# Patient Record
Sex: Female | Born: 1980 | Race: White | Hispanic: No | Marital: Married | Smoking: Never smoker
Health system: Southern US, Community
[De-identification: ages and names within clinical notes are randomized; demographics above are authoritative.]

## PROBLEM LIST (undated history)

## (undated) DIAGNOSIS — R51 Headache: Secondary | ICD-10-CM

## (undated) DIAGNOSIS — J3489 Other specified disorders of nose and nasal sinuses: Secondary | ICD-10-CM

---
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
## 1999-02-19 ENCOUNTER — Other Ambulatory Visit: Admission: RE | Admit: 1999-02-19 | Discharge: 1999-02-19 | Payer: Self-pay | Admitting: Obstetrics and Gynecology

## 1999-05-22 ENCOUNTER — Other Ambulatory Visit: Admission: RE | Admit: 1999-05-22 | Discharge: 1999-05-22 | Payer: Self-pay | Admitting: Obstetrics & Gynecology

## 2000-01-15 ENCOUNTER — Other Ambulatory Visit: Admission: RE | Admit: 2000-01-15 | Discharge: 2000-01-15 | Payer: Self-pay | Admitting: Obstetrics & Gynecology

## 2000-04-13 ENCOUNTER — Emergency Department (HOSPITAL_COMMUNITY): Admission: EM | Admit: 2000-04-13 | Discharge: 2000-04-13 | Payer: Self-pay

## 2000-04-13 ENCOUNTER — Encounter: Payer: Self-pay | Admitting: Emergency Medicine

## 2001-01-19 ENCOUNTER — Other Ambulatory Visit: Admission: RE | Admit: 2001-01-19 | Discharge: 2001-01-19 | Payer: Self-pay | Admitting: Obstetrics & Gynecology

## 2002-02-21 ENCOUNTER — Other Ambulatory Visit: Admission: RE | Admit: 2002-02-21 | Discharge: 2002-02-21 | Payer: Self-pay | Admitting: Obstetrics & Gynecology

## 2003-05-09 ENCOUNTER — Encounter: Admission: RE | Admit: 2003-05-09 | Discharge: 2003-05-09 | Payer: Self-pay | Admitting: Internal Medicine

## 2004-02-12 ENCOUNTER — Other Ambulatory Visit: Admission: RE | Admit: 2004-02-12 | Discharge: 2004-02-12 | Payer: Self-pay | Admitting: Obstetrics & Gynecology

## 2005-02-18 ENCOUNTER — Other Ambulatory Visit: Admission: RE | Admit: 2005-02-18 | Discharge: 2005-02-18 | Payer: Self-pay | Admitting: Obstetrics & Gynecology

## 2007-02-21 ENCOUNTER — Inpatient Hospital Stay (HOSPITAL_COMMUNITY): Admission: AD | Admit: 2007-02-21 | Discharge: 2007-02-28 | Payer: Self-pay | Admitting: Obstetrics & Gynecology

## 2007-02-24 ENCOUNTER — Encounter (INDEPENDENT_AMBULATORY_CARE_PROVIDER_SITE_OTHER): Payer: Self-pay | Admitting: Obstetrics & Gynecology

## 2010-05-09 ENCOUNTER — Other Ambulatory Visit: Payer: Self-pay | Admitting: Obstetrics & Gynecology

## 2010-05-21 ENCOUNTER — Other Ambulatory Visit: Payer: Self-pay | Admitting: Obstetrics & Gynecology

## 2010-08-05 NOTE — Op Note (Signed)
[name], SEVERTSON              ACCOUNT NO.:  [id number]   MEDICAL RECORD NO.:  [id number]          PATIENT TYPE:  INP   LOCATION:  9155                          FACILITY:  [facility]   PHYSICIAN:  Gerrit Friends. Aldona Bar, M.D.   DATE OF BIRTH:  10/28/1980   DATE OF PROCEDURE:  02/24/2007  DATE OF DISCHARGE:                               OPERATIVE REPORT   PREOPERATIVE DIAGNOSES:  56 to 34-week intrauterine pregnancy, preterm  labor, probable borderline preeclampsia, twins, status post antenatal  steroids on tocolysis - breaking through.   POSTOPERATIVE DIAGNOSES:  33 to 34-week intrauterine pregnancy, preterm  labor, probable borderline preeclampsia, twins, status post antenatal  steroids on tocolysis - breaking through, delivery of twin A from a  breech presentation, weight 2180 grams, Apgars 7 and 8, and delivery of  twin B vertex, 2094 grams, Apgars 7 and 8.   PROCEDURE:  Primary low transverse cesarean section.   SURGEON:  Dr. Aldona Bar.   Spinal, Dr. Arby Barrette.   HISTORY:  This 30 year old gravida 2, para 0 was admitted on 02/21/07  await by Dr. Dareen Piano at almost [redacted] weeks gestation with known twins and  a pregnancy was uncomplicated prior to admission.  On admission the  patient was noted to be having contractions.  Her cervix was a  centimeter dilated with the vertex at -2 station she was admitted.  She  underwent tocolysis and eventually required magnesium sulfate, was  treated with intravenous ampicillin after strep culture was done and  received antenatal steroids - dexamethasone protocol.  She ultimately  was weaned off her magnesium sulfate and placed on Procardia and on the  morning of 12/04 was not having much in the way of contractions.  Her  ampicillin was discontinued as her strep culture was negative and plans  were made to watch her for another 24 hours and hopefully discharge her  home on Procardia.  Around 10 o'clock on the morning of 12/04 the  patient began having strong  palpable contractions and on evaluation, was  found to be at least 3 cm dilated and felt like on examination the  presenting part of twin A was breech.  Decision was made to proceed with  delivery by cesarean section rather than go back to magnesium sulfate as  the patient was already on oral Procardia.  Both babies looked good and  her gestational age was beyond 62 weeks' gestation.   The patient was taken to the operating room where spinal was placed by  Dr. Arby Barrette without difficulty.  After spinal was placed, the patient  was positioned appropriately and thereafter was prepped and draped in  usual fashion for cesarean section with a Foley catheter inserted as  part of prep.   After the patient was appropriately draped and good anesthetic levels  were documented procedure was begun.  A Pfannenstiel incision was made  and with minimal difficulty dissected down sharply to and through the  fascia in low transverse fashion with hemostasis created each layer.  Subfascial space was created inferiorly and superiorly, muscles  separated in the midline, peritoneum identified and entered  appropriately  with good entrance in the peritoneal cavity with care  taken to avoid the bowel superiorly and bladder inferiorly.  At this  time the vesicouterine peritoneum was identified, incised in a low  transverse fashion and pushed off the lower uterine segment with ease.  Sharp incision into the lower uterine segment was then made with  Metzenbaum scissors and amniotomy produced and incision extended  laterally.  It became apparent that what was felt to have been twin B  and was known to be breech had slipped below twin A and indeed twin A  was what previously was thought to be twin B and was delivered from a  breech presentation using a modified __________ maneuver without  difficulty.  Once the cord was clamped and cut, the infant was passed  off to the waiting team and subsequently taken to the  newborn intensive  care unit.  Subsequent weight was found to be  2180 grams and Apgars  were noted to be 7and 8.  This was accomplished of course after the cord  was clamped and identified as being from twin A.   At this time amniotomy was produced and twin B which previously had been  twin A was delivered from vertex position.  Amniotic fluid on both twin  A and twin B was noted to be clear.  Once twin B was delivered and the  cord was clamped and cut the infant was passed off to the awaiting team  and subsequently taken to the newborn intensive care unit as well.  The  weight was noted to be 2094 grams and Apgars were 7 and 8.   At this time cord bloods were collected from twin A and twin B  respectively and placenta was delivered intact.  Placenta was then sent  to pathology labeled twins at 103 weeks' gestation.  Once the placenta  was delivered, good uterine contractility was afforded with slowly given  intravenous Pitocin and manual stimulation and the uterus was  exteriorized.  At this time, closure of the uterine incision was carried  out using #1 Vicryl running locking fashion from angle to angle  bilaterally and additional figure-of-eight interrupted #1 Vicryl sutures  placed for additional hemostasis and support of the incision.  Once this  was carried out, the incision was dry.  Tubes and ovaries were inspected  and noted to be normal.  Cul-de-sac was free of any blood and clot and  with good uterine contractility and the incision dry.  The uterus was  then replaced in the abdominal cavity.  At this time counts were noted  to be correct and no foreign bodies noted to be remaining abdominal  cavity.  After irrigation again noting the uterine incision was dry,  closure of the abdomen was carried out in layers.  The abdominal  peritoneum was closed with 0 Vicryl in running fashion and muscle  secured with same.  Assured of good subfascial hemostasis, fascia was  then  reapproximated using 0 Vicryl from angle to midline bilaterally.  Subcutaneous tissues were rendered hemostatic and staples were used to  close skin.  Sterile pressure dressing was applied and the patient at  this time was transported to recovery room in satisfactory condition,  having tolerated procedure well.  Estimated blood loss 500 mL.  All  counts correct x2.  Both babies were transported to the newborn  intensive care unit under the care of Dr. Ruben Gottron.  Mother was  transported to recovery room and was  doing well.   In summary this 30 year old gravida 2, para 0 was admitted to hospital  on 12/01 with preterm contractions, began having increased preterm  contractions on oral tocolysis after having received magnesium sulfate  and steroids and was found to be progressively dilating her cervix.  She  was taken to the operating room and delivered by primary low transverse  cesarean section.  Twin A was delivered from a breech presentation and  twin B was delivered from vertex presentation.  In utero twin B was  really twin A and twin A was really twin B but apparently once the  patient began laboring, twin B slipped below twin A and was delivered  and called twin A.   Condition on arrival in recovery satisfactory.      Gerrit Friends. Aldona Bar, M.D.  Electronically Signed     RMW/MEDQ  D:  02/24/2007  T:  02/24/2007  Job:  [id number]

## 2010-08-08 NOTE — Discharge Summary (Signed)
NAMELILLYANNE, [name]              ACCOUNT NO.:  [id number]   MEDICAL RECORD NO.:  [id number]          PATIENT TYPE:  INP   LOCATION:  9317                          FACILITY:  [facility]   PHYSICIAN:  Malva Limes, M.D.    DATE OF BIRTH:  [birth year]   DATE OF ADMISSION:  02/21/2007  DATE OF DISCHARGE:  02/28/2007                               DISCHARGE SUMMARY   FINAL DIAGNOSES:  Twin gestation 33-34 weeks with preterm labor, mild  preeclampsia.   PROCEDURE:  Primary low transverse cesarean section.   SURGEON:  Gerrit Friends. Aldona Bar, M.D.   COMPLICATIONS:  None.   HISTORY:  This 30 year old G2, P0-0-1-0 presented at [redacted] weeks gestation  with known twin gestation and uterine contractions, preterm labor.  The  patient's antepartum course up to this point had been uncomplicated.  The patient did conceive on Clomid.  Ultrasounds had been performed  showing concordant growth and normal AFIs.  The patient's cervix upon  admission was 1 cm dilated and a -2 station.  The patient underwent  tocolysis and eventually required mag sulfate.  Group B strep culture  was performed and the patient was started on IV ampicillin for  prophylaxis.  The patient was able to be weaned off her mag sulfate and  placed on oral Procardia on the morning of December 4.  Group B strep  culture returned at that time and it was negative and the patient was  planned on being able to be sent home later that day but the patient  started having some more palpable contractions at that time, was 33 cm  dilated and presenting part of twin A was breech.  At this point a  discussion was had with the patient and the decision was to proceed with  a cesarean section at this time.  The patient was taken to the operating  room on February 24, 2007, by Dr. Annamaria Helling where a primary low  transverse cesarean section was performed with the delivery of twin A  weighing 4 pounds 12.9 ounces, a female infant with Apgars of 7 and 9.  Twin A was in  a breech presentation.  Twin B was then delivered weighing  4 pounds, 9.8 ounces, another female infant with Apgars of 7 and 9.  Twin  B was in a vertex presentation.  Delivery went without complications.  The patient's postoperative course was benign without any significant  fevers.  The babies were in the NICU but stable.  Blood pressures were  slightly elevated at this time.  The patient was started on some  hydrochlorothiazide on the 6th.  Her blood pressures continued to be  elevated and therefore the patient was increased to 25 mg of  hydrochlorothiazide and then changed to labetalol on postoperative day  #4.  By postoperative day #4 the patient's blood pressure stabilized on  labetalol and she was felt ready for discharge.  She was sent home on a  regular diet to decrease her activities, to continue her labetalol 100  mg b.i.d.  She was given Percocet one to two every 4-6 hours as needed  for the pain.  The patient was to call with any increased temperatures  or elevated blood pressures or signs or symptoms of preeclampsia.  Instructions and precautions were reviewed with the patient and she was  to follow up in our office as directed.   LABS ON DISCHARGE:  The patient had a hemoglobin of 10.6, white blood  cell count of 12.4, platelets of 128,000 which had come up from a low of  110,000.  The patient also had recurrent normal PIH panel except for the  one on the 5th which did show an elevated LDH of 276 but normal uric  acid and normal liver function.      Leilani Able, P.A.-C.    ______________________________  Malva Limes, M.D.    MB/MEDQ  D:  03/27/2007  T:  03/27/2007  Job:  [id number]

## 2010-09-30 ENCOUNTER — Other Ambulatory Visit: Payer: Self-pay | Admitting: Obstetrics & Gynecology

## 2010-12-29 LAB — DIFFERENTIAL
Basophils Relative: 0
Eosinophils Absolute: 0.1 — ABNORMAL LOW
Eosinophils Relative: 1
Eosinophils Relative: 1
Lymphocytes Relative: 16
Monocytes Absolute: 1.1 — ABNORMAL HIGH
Monocytes Relative: 8
Neutro Abs: 7.4
Neutro Abs: 9.2 — ABNORMAL HIGH
Neutrophils Relative %: 69
Neutrophils Relative %: 74

## 2010-12-29 LAB — COMPREHENSIVE METABOLIC PANEL
AST: 21
AST: 32
Albumin: 1.8 — ABNORMAL LOW
Albumin: 2.5 — ABNORMAL LOW
Alkaline Phosphatase: 114
Alkaline Phosphatase: 140 — ABNORMAL HIGH
BUN: 2 — ABNORMAL LOW
BUN: 7
CO2: 20
CO2: 20
CO2: 25
Calcium: 7 — ABNORMAL LOW
Calcium: 8.2 — ABNORMAL LOW
Chloride: 107
Chloride: 109
Creatinine, Ser: 0.63
GFR calc Af Amer: >60
GFR calc Af Amer: >60
GFR calc Af Amer: >60
GFR calc non Af Amer: >60
GFR calc non Af Amer: >60
Glucose, Bld: 152 — ABNORMAL HIGH
Glucose, Bld: 92
Glucose, Bld: 99
Potassium: 3.3 — ABNORMAL LOW
Potassium: 4.1
Sodium: 134 — ABNORMAL LOW
Sodium: 137
Sodium: 138
Sodium: 139
Total Bilirubin: 0.6
Total Bilirubin: 0.8
Total Protein: 4.3 — ABNORMAL LOW

## 2010-12-29 LAB — CBC
HCT: 30.2 — ABNORMAL LOW
HCT: 33.7 — ABNORMAL LOW
HCT: 35.7 — ABNORMAL LOW
Hemoglobin: 12.3
MCHC: 35.2
MCV: 93.8
Platelets: 110 — ABNORMAL LOW
Platelets: 129 — ABNORMAL LOW
RBC: 3.57 — ABNORMAL LOW
RBC: 3.84 — ABNORMAL LOW
RDW: 13.3
WBC: 10.5
WBC: 10.7 — ABNORMAL HIGH
WBC: 12.4 — ABNORMAL HIGH
WBC: 9.9

## 2010-12-29 LAB — URINALYSIS, ROUTINE W REFLEX MICROSCOPIC
Bilirubin Urine: NEGATIVE
Glucose, UA: NEGATIVE
Hgb urine dipstick: NEGATIVE
Ketones, ur: NEGATIVE
Nitrite: NEGATIVE
Specific Gravity, Urine: <1.005 — ABNORMAL LOW
pH: 6.5

## 2010-12-29 LAB — LACTATE DEHYDROGENASE
LDH: 136
LDH: 147

## 2010-12-29 LAB — URIC ACID: Uric Acid, Serum: 5.5

## 2012-02-01 ENCOUNTER — Other Ambulatory Visit: Payer: Self-pay | Admitting: Otolaryngology

## 2012-02-01 DIAGNOSIS — R0981 Nasal congestion: Secondary | ICD-10-CM

## 2012-02-04 ENCOUNTER — Ambulatory Visit
Admission: RE | Admit: 2012-02-04 | Discharge: 2012-02-04 | Disposition: A | Discharge status: Home / Self Care | Payer: BC Managed Care – PPO | Source: Ambulatory Visit | Attending: Otolaryngology | Admitting: Otolaryngology

## 2012-02-04 DIAGNOSIS — R0981 Nasal congestion: Secondary | ICD-10-CM

## 2012-02-21 HISTORY — PX: NASAL SEPTUM SURGERY: SHX37

## 2012-08-21 DIAGNOSIS — J3489 Other specified disorders of nose and nasal sinuses: Secondary | ICD-10-CM

## 2012-08-21 HISTORY — DX: Other specified disorders of nose and nasal sinuses: J34.89

## 2012-09-15 ENCOUNTER — Encounter (HOSPITAL_BASED_OUTPATIENT_CLINIC_OR_DEPARTMENT_OTHER): Payer: Self-pay | Admitting: *Deleted

## 2012-09-19 NOTE — H&P (Signed)
PREOPERATIVE H&P  Chief Complaint: nodule in nose  HPI: [name] is a 32 y.o. female who presents for evaluation of nodule on the nasal dorsum that extends to the upper cartilaginous septum. She's taken to the OR to have this removed.  Past Medical History  Diagnosis Date  . Headache(784.0)     migraines  . Internal nasal lesion 08/2012    nasal nodule   Past Surgical History  Procedure Laterality Date  . Nasal septum surgery  02/2012  . Cesarean section  02/24/2007   History   Social History  . Marital Status: Married    Spouse Name: N/A    Number of Children: N/A  . Years of Education: N/A   Social History Main Topics  . Smoking status: Never Smoker   . Smokeless tobacco: Never Used  . Alcohol Use: No  . Drug Use: No  . Sexually Active: None   Other Topics Concern  . None   Social History Narrative  . None   History reviewed. No pertinent family history. Allergies  Allergen Reactions  . Latex Rash    PERINEAL AREA ONLY  . Monistat (Miconazole Nitrate-Wipes) Swelling and Rash   Prior to Admission medications   Medication Sig Start Date End Date Taking? Authorizing Provider  Multiple Vitamin (MULTIVITAMIN) tablet Take 1 tablet by mouth daily.   Yes Historical Provider, MD  naratriptan (AMERGE) 1 MG TABS Take 1 mg by mouth once as needed. Take one (1) tablet at onset of headache; if returns or does not resolve, may repeat after 4 hours; do not exceed five (5) mg in 24 hours.   Yes Historical Provider, MD  norgestimate-ethinyl estradiol (ORTHO-CYCLEN,SPRINTEC,PREVIFEM) 0.25-35 MG-MCG tablet Take 1 tablet by mouth daily.   Yes Historical Provider, MD     Positive ROS: partial nasal obstruction.  All other systems have been reviewed and were otherwise negative with the exception of those mentioned in the HPI and as above.  Physical Exam: There were no vitals filed for this visit.  General: Alert, no acute distress Oral: Normal oral mucosa and  tonsils Nasal: Nodule on nasal septum extends to upper cartilaginous septum. Neck: No palpable adenopathy or thyroid nodules Ear: Ear canal is clear with normal appearing TMs Cardiovascular: Regular rate and rhythm, no murmur.  Respiratory: Clear to auscultation Neurologic: Alert and oriented x 3   Assessment/Plan: nasal nodule Plan for Procedure(s): EXCISION OF A  INNER NASAL LESION   Dillard Cannon, MD 09/19/2012 2:21 PM 2

## 2012-09-20 ENCOUNTER — Encounter (HOSPITAL_BASED_OUTPATIENT_CLINIC_OR_DEPARTMENT_OTHER): Payer: Self-pay

## 2012-09-20 ENCOUNTER — Ambulatory Visit (HOSPITAL_BASED_OUTPATIENT_CLINIC_OR_DEPARTMENT_OTHER)
Admission: RE | Admit: 2012-09-20 | Discharge: 2012-09-20 | Disposition: A | Discharge status: Home / Self Care | Payer: BC Managed Care – PPO | Source: Ambulatory Visit | Attending: Otolaryngology | Admitting: Otolaryngology

## 2012-09-20 ENCOUNTER — Encounter (HOSPITAL_BASED_OUTPATIENT_CLINIC_OR_DEPARTMENT_OTHER)
Admission: RE | Disposition: A | Discharge status: Home / Self Care | Payer: Self-pay | Source: Ambulatory Visit | Attending: Otolaryngology

## 2012-09-20 ENCOUNTER — Encounter (HOSPITAL_BASED_OUTPATIENT_CLINIC_OR_DEPARTMENT_OTHER): Payer: Self-pay | Admitting: Anesthesiology

## 2012-09-20 ENCOUNTER — Ambulatory Visit (HOSPITAL_BASED_OUTPATIENT_CLINIC_OR_DEPARTMENT_OTHER): Payer: BC Managed Care – PPO | Admitting: Anesthesiology

## 2012-09-20 DIAGNOSIS — J3489 Other specified disorders of nose and nasal sinuses: Secondary | ICD-10-CM | POA: Insufficient documentation

## 2012-09-20 DIAGNOSIS — G43909 Migraine, unspecified, not intractable, without status migrainosus: Secondary | ICD-10-CM | POA: Insufficient documentation

## 2012-09-20 HISTORY — DX: Headache: R51

## 2012-09-20 HISTORY — PX: EXCISION NASAL MASS: SHX6271

## 2012-09-20 HISTORY — DX: Other specified disorders of nose and nasal sinuses: J34.89

## 2012-09-20 SURGERY — EXCISION, MASS, NOSE
Anesthesia: General | Site: Nose | Wound class: Clean Contaminated

## 2012-09-20 MED ORDER — FENTANYL CITRATE 0.05 MG/ML IJ SOLN
INTRAMUSCULAR | Status: DC | PRN
  Administered 2012-09-20 (×2): 50 ug via INTRAVENOUS

## 2012-09-20 MED ORDER — MIDAZOLAM HCL 2 MG/ML PO SYRP: 12.0000 mg | ORAL_SOLUTION | Freq: Once | ORAL | Status: DC | PRN

## 2012-09-20 MED ORDER — MIDAZOLAM HCL 5 MG/5ML IJ SOLN
INTRAMUSCULAR | Status: DC | PRN
  Administered 2012-09-20: 2 mg via INTRAVENOUS

## 2012-09-20 MED ORDER — CEFAZOLIN SODIUM-DEXTROSE 2-3 GM-% IV SOLR
INTRAVENOUS | Status: DC | PRN
  Administered 2012-09-20: 2 g via INTRAVENOUS

## 2012-09-20 MED ORDER — ONDANSETRON HCL 4 MG/2ML IJ SOLN
INTRAMUSCULAR | Status: DC | PRN
  Administered 2012-09-20: 4 mg via INTRAVENOUS

## 2012-09-20 MED ORDER — LIDOCAINE HCL (CARDIAC) 20 MG/ML IV SOLN
INTRAVENOUS | Status: DC | PRN
  Administered 2012-09-20: 50 mg via INTRAVENOUS

## 2012-09-20 MED ORDER — CEPHALEXIN 500 MG PO CAPS: 500.0000 mg | ORAL_CAPSULE | Freq: Two times a day (BID) | ORAL | Status: AC

## 2012-09-20 MED ORDER — LACTATED RINGERS IV SOLN
INTRAVENOUS | Status: DC
  Administered 2012-09-20 (×2): via INTRAVENOUS

## 2012-09-20 MED ORDER — PROPOFOL 10 MG/ML IV BOLUS
INTRAVENOUS | Status: DC | PRN
  Administered 2012-09-20: 150 mg via INTRAVENOUS

## 2012-09-20 MED ORDER — SUCCINYLCHOLINE CHLORIDE 20 MG/ML IJ SOLN
INTRAMUSCULAR | Status: DC | PRN
  Administered 2012-09-20: 80 mg via INTRAVENOUS

## 2012-09-20 MED ORDER — FENTANYL CITRATE 0.05 MG/ML IJ SOLN: 50.0000 ug | INTRAMUSCULAR | Status: DC | PRN

## 2012-09-20 MED ORDER — MIDAZOLAM HCL 2 MG/2ML IJ SOLN: 1.0000 mg | INTRAMUSCULAR | Status: DC | PRN

## 2012-09-20 MED ORDER — EPHEDRINE SULFATE 50 MG/ML IJ SOLN
INTRAMUSCULAR | Status: DC | PRN
  Administered 2012-09-20 (×2): 10 mg via INTRAVENOUS

## 2012-09-20 MED ORDER — OXYMETAZOLINE HCL 0.05 % NA SOLN
NASAL | Status: DC | PRN
  Administered 2012-09-20: 1 via NASAL

## 2012-09-20 MED ORDER — DEXAMETHASONE SODIUM PHOSPHATE 4 MG/ML IJ SOLN
INTRAMUSCULAR | Status: DC | PRN
  Administered 2012-09-20: 10 mg via INTRAVENOUS

## 2012-09-20 MED ORDER — HYDROMORPHONE HCL PF 1 MG/ML IJ SOLN: 0.2500 mg | INTRAMUSCULAR | Status: DC | PRN

## 2012-09-20 MED ORDER — LIDOCAINE-EPINEPHRINE 1 %-1:100000 IJ SOLN
INTRAMUSCULAR | Status: DC | PRN
  Administered 2012-09-20: 1 mL

## 2012-09-20 SURGICAL SUPPLY — 39 items
APPLICATOR COTTON TIP 6IN STRL (MISCELLANEOUS) ×2 IMPLANT
CANISTER SUCTION 1200CC (MISCELLANEOUS) ×2 IMPLANT
CLOTH BEACON ORANGE TIMEOUT ST (SAFETY) ×2 IMPLANT
COAGULATOR SUCT 8FR VV (MISCELLANEOUS) IMPLANT
CORDS BIPOLAR (ELECTRODE) IMPLANT
DECANTER SPIKE VIAL GLASS SM (MISCELLANEOUS) IMPLANT
DEPRESSOR TONGUE BLADE STERILE (MISCELLANEOUS) IMPLANT
DRESSING NASAL POPE 10X1.5X2.5 (GAUZE/BANDAGES/DRESSINGS) IMPLANT
DRSG NASAL POPE 10X1.5X2.5 (GAUZE/BANDAGES/DRESSINGS)
DRSG TELFA 3X8 NADH (GAUZE/BANDAGES/DRESSINGS) IMPLANT
ELECT REM PT RETURN 9FT ADLT (ELECTROSURGICAL) ×2
ELECT REM PT RETURN 9FT PED (ELECTROSURGICAL)
ELECTRODE REM PT RETRN 9FT PED (ELECTROSURGICAL) IMPLANT
ELECTRODE REM PT RTRN 9FT ADLT (ELECTROSURGICAL) IMPLANT
GAUZE SPONGE 4X4 12PLY STRL LF (GAUZE/BANDAGES/DRESSINGS) ×1 IMPLANT
GLOVE BIOGEL PI IND STRL 6.5 (GLOVE) IMPLANT
GLOVE BIOGEL PI IND STRL 7.0 (GLOVE) IMPLANT
GLOVE BIOGEL PI INDICATOR 6.5 (GLOVE) ×1
GLOVE BIOGEL PI INDICATOR 7.0 (GLOVE) ×1
GLOVE SKINSENSE NS SZ7.5 (GLOVE) ×1
GLOVE SKINSENSE STRL SZ7.5 (GLOVE) IMPLANT
GLOVE SS BIOGEL STRL SZ 7.5 (GLOVE) ×1 IMPLANT
GLOVE SUPERSENSE BIOGEL SZ 7.5 (GLOVE)
GOWN PREVENTION PLUS XLARGE (GOWN DISPOSABLE) ×2 IMPLANT
GOWN PREVENTION PLUS XXLARGE (GOWN DISPOSABLE) ×1 IMPLANT
HEMOSTAT SURGICEL 2X14 (HEMOSTASIS) IMPLANT
NEEDLE 27GAX1X1/2 (NEEDLE) ×1 IMPLANT
PACK BASIN DAY SURGERY FS (CUSTOM PROCEDURE TRAY) ×1 IMPLANT
PACK ENT DAY SURGERY (CUSTOM PROCEDURE TRAY) ×1 IMPLANT
PAD DRESSING TELFA 3X8 NADH (GAUZE/BANDAGES/DRESSINGS) IMPLANT
PATTIES SURGICAL .5 X3 (DISPOSABLE) ×1 IMPLANT
SHEET MEDIUM DRAPE 40X70 STRL (DRAPES) ×1 IMPLANT
SOLUTION BUTLER CLEAR DIP (MISCELLANEOUS) IMPLANT
SPONGE GAUZE 2X2 8PLY STRL LF (GAUZE/BANDAGES/DRESSINGS) ×1 IMPLANT
SUT CHROMIC 5 0 P 3 (SUTURE) ×1 IMPLANT
SYR CONTROL 10ML LL (SYRINGE) IMPLANT
TOWEL OR 17X24 6PK STRL BLUE (TOWEL DISPOSABLE) ×2 IMPLANT
TUBE CONNECTING 20X1/4 (TUBING) ×1 IMPLANT
YANKAUER SUCT BULB TIP NO VENT (SUCTIONS) ×2 IMPLANT

## 2012-09-20 NOTE — Brief Op Note (Signed)
09/20/2012  8:37 AM  PATIENT:  [name]  32 y.o. female  PRE-OPERATIVE DIAGNOSIS:  nasal nodule  POST-OPERATIVE DIAGNOSIS:  nasal nodule  PROCEDURE:  Procedure(s): EXCISION OF A  INNER NASAL LESION (N/A)  SURGEON:  Surgeon(s) and Role:    * Drema Halon, MD - Primary  PHYSICIAN ASSISTANT:   ASSISTANTS: none   ANESTHESIA:   general  EBL:  Total I/O In: 1000 [I.V.:1000] Out: -   BLOOD ADMINISTERED:none  DRAINS: none   LOCAL MEDICATIONS USED:  XYLOCAINE wit EPI  SPECIMEN:  No Specimen  DISPOSITION OF SPECIMEN:  N/A  COUNTS:  YES  TOURNIQUET:  * No tourniquets in log *  DICTATION: .Other Dictation: Dictation Number [id number]  PLAN OF CARE: Discharge to home after PACU  PATIENT DISPOSITION:  PACU - hemodynamically stable.   Delay start of Pharmacological VTE agent (>24hrs) due to surgical blood loss or risk of bleeding: yes

## 2012-09-20 NOTE — Interval H&P Note (Signed)
History and Physical Interval Note:  09/20/2012 7:29 AM  [name]  has presented today for surgery, with the diagnosis of nasal nodule  The various methods of treatment have been discussed with the patient and family. After consideration of risks, benefits and other options for treatment, the patient has consented to  Procedure(s): EXCISION OF A  INNER NASAL LESION (N/A) as a surgical intervention .  The patient's history has been reviewed, patient examined, no change in status, stable for surgery.  I have reviewed the patient's chart and labs.  Questions were answered to the patient's satisfaction.     Larrissa Stivers

## 2012-09-20 NOTE — Anesthesia Postprocedure Evaluation (Signed)
  Anesthesia Post-op Note  Patient: [name]  Procedure(s) Performed: Procedure(s): EXCISION OF A  INNER NASAL LESION (N/A)  Patient Location: PACU  Anesthesia Type:General  Level of Consciousness: awake  Airway and Oxygen Therapy: Patient Spontanous Breathing  Post-op Pain: mild  Post-op Assessment: Post-op Vital signs reviewed  Post-op Vital Signs: Reviewed  Complications: No apparent anesthesia complications

## 2012-09-20 NOTE — Transfer of Care (Signed)
Immediate Anesthesia Transfer of Care Note  Patient: [name]  Procedure(s) Performed: Procedure(s): EXCISION OF A  INNER NASAL LESION (N/A)  Patient Location: PACU  Anesthesia Type:General  Level of Consciousness: awake, alert  and oriented  Airway & Oxygen Therapy: Patient Spontanous Breathing and Patient connected to face mask oxygen  Post-op Assessment: Report given to PACU RN and Post -op Vital signs reviewed and stable  Post vital signs: Reviewed and stable  Complications: No apparent anesthesia complications

## 2012-09-20 NOTE — Anesthesia Preprocedure Evaluation (Signed)
Anesthesia Evaluation  Patient identified by MRN, date of birth, ID band Patient awake    Reviewed: Allergy & Precautions, H&P , Patient's Chart, lab work & pertinent test results  Airway Mallampati: I      Dental   Pulmonary neg pulmonary ROS,  breath sounds clear to auscultation        Cardiovascular negative cardio ROS  Rhythm:Regular Rate:Normal     Neuro/Psych    GI/Hepatic negative GI ROS, Neg liver ROS,   Endo/Other  negative endocrine ROS  Renal/GU negative Renal ROS     Musculoskeletal   Abdominal   Peds  Hematology   Anesthesia Other Findings   Reproductive/Obstetrics                           Anesthesia Physical Anesthesia Plan  ASA: I  Anesthesia Plan: General   Post-op Pain Management:    Induction: Intravenous  Airway Management Planned: Oral ETT  Additional Equipment:   Intra-op Plan:   Post-operative Plan:   Informed Consent: I have reviewed the patients History and Physical, chart, labs and discussed the procedure including the risks, benefits and alternatives for the proposed anesthesia with the patient or authorized representative who has indicated his/her understanding and acceptance.   Dental advisory given  Plan Discussed with: CRNA, Anesthesiologist and Surgeon  Anesthesia Plan Comments:         Anesthesia Quick Evaluation

## 2012-09-20 NOTE — Anesthesia Procedure Notes (Signed)
Procedure Name: Intubation Date/Time: 09/20/2012 7:40 AM Performed by: Zenia Resides D Pre-anesthesia Checklist: Patient identified, Emergency Drugs available, Suction available and Patient being monitored Patient Re-evaluated:Patient Re-evaluated prior to inductionOxygen Delivery Method: Circle System Utilized Preoxygenation: Pre-oxygenation with 100% oxygen Intubation Type: IV induction Ventilation: Mask ventilation without difficulty Laryngoscope Size: Mac and 3 Grade View: Grade I Tube type: Oral Tube size: 7.0 mm Number of attempts: 1 Airway Equipment and Method: stylet and oral airway Placement Confirmation: ETT inserted through vocal cords under direct vision,  positive ETCO2 and breath sounds checked- equal and bilateral Secured at: 22 cm Tube secured with: Tape Dental Injury: Teeth and Oropharynx as per pre-operative assessment

## 2012-09-21 LAB — POCT HEMOGLOBIN-HEMACUE: Hemoglobin: 13.5 g/dL (ref 12.0–15.0)

## 2012-09-21 NOTE — Op Note (Signed)
[name], [name]              ACCOUNT NO.:  [id number]  MEDICAL RECORD NO.:  [id number]  LOCATION:                                FACILITY:  [facility]  PHYSICIAN:  Kristine Garbe. Ezzard Standing, M.D.DATE OF BIRTH:  Jun 17, 1980  DATE OF PROCEDURE:  09/20/2012 DATE OF DISCHARGE:  09/20/2012                              OPERATIVE REPORT   PREOPERATIVE DIAGNOSIS:  Nasal nodule.  POSTOPERATIVE DIAGNOSIS:  Nasal nodule.  OPERATION:  Intranasal approach to excision of nasal nodule, 1 cm.  SURGEON:  Kristine Garbe. Ezzard Standing, MD  ANESTHESIA:  General endotracheal.  COMPLICATIONS:  None.  DESCRIPTION OF PROCEDURE:  After adequate endotracheal anesthesia, the patient received 1 g of Ancef IV preoperatively.  The nodule was sitting on the nasal dorsum adjacent to the nasal septum superiorly. Intercartilaginous incision was made between the upper and lower lateral cartilage on the right side.  Dissection was carried out over the nodule, this actually representing a prominence of the septal cartilage, perhaps a little bit of a lower lateral cartilage, appeared benign, this was excised with a scalpel and then rasped smoothly.  She had a little bit of prominence of the lower lateral cartilages at the dome tip but nothing was done in this area.  This completed the procedure.  There was minimal bleeding.  The intercartilaginous incision was closed with a single 5-0 chromic suture.  The dorsum of the nose was then taped with Steri-Strips.  No bony abnormality noted, and no thermoplastic cast was applied.  This completed the procedure.  Lizabeth was discharged home later this morning on Keflex 500 mg b.i.d. for 1 week and will have a followup in my office in 1 week for recheck.          ______________________________ Kristine Garbe. Ezzard Standing, M.D.     CEN/MEDQ  D:  09/20/2012  T:  09/20/2012  Job:  [id number]

## 2012-09-22 ENCOUNTER — Encounter (HOSPITAL_BASED_OUTPATIENT_CLINIC_OR_DEPARTMENT_OTHER): Payer: Self-pay | Admitting: Otolaryngology

## 2012-10-17 ENCOUNTER — Other Ambulatory Visit: Payer: Self-pay | Admitting: Obstetrics & Gynecology

## 2013-03-23 HISTORY — PX: ANKLE SURGERY: SHX546

## 2014-02-08 ENCOUNTER — Encounter: Payer: Self-pay | Admitting: Podiatry

## 2014-02-08 ENCOUNTER — Ambulatory Visit (INDEPENDENT_AMBULATORY_CARE_PROVIDER_SITE_OTHER): Payer: BC Managed Care – PPO

## 2014-02-08 ENCOUNTER — Ambulatory Visit (INDEPENDENT_AMBULATORY_CARE_PROVIDER_SITE_OTHER): Payer: BC Managed Care – PPO | Admitting: Podiatry

## 2014-02-08 VITALS — Ht 66.0 in | Wt 134.0 lb

## 2014-02-08 DIAGNOSIS — B079 Viral wart, unspecified: Secondary | ICD-10-CM

## 2014-02-08 DIAGNOSIS — M79604 Pain in right leg: Secondary | ICD-10-CM

## 2014-02-08 DIAGNOSIS — B078 Other viral warts: Secondary | ICD-10-CM

## 2014-02-08 NOTE — Progress Notes (Signed)
Subjective:     Patient ID: Megan BakesMelissa J Meadows, female   DOB: Aug 15, 1980, 33 y.o.   MRN: [id number]  HPI patient presents stating I started to develop some discomfort in my second toe right a few months ago and now underneath my second toe right it's been irritated. Is not sure if she stepped on something or traumatized it or if it may be something else   Review of Systems  All other systems reviewed and are negative.      Objective:   Physical Exam  Constitutional: She is oriented to person, place, and time.  Cardiovascular: Intact distal pulses.   Musculoskeletal: Normal range of motion.  Neurological: She is oriented to person, place, and time.  Skin: Skin is warm.  Nursing note and vitals reviewed.  neurovascular status intact with muscle strength adequate range of motion within normal limits. Patient's digits are well-perfused she is well oriented 3 and she's noted on the right second toe to have distal keratotic lesion distal medial side and then a small trauma spot on the plantar aspect of the right second toe that is painful when pressed     Assessment:     Possible foreign body versus verruca plantaris or other soft tissue lesion right second toe    Plan:     H&P and x-rays reviewed. Debrided the areas and applied chemical to create an immune response and sterile dressing. Reappoint in 4 weeks to reevaluate and explained what to do if it does blister

## 2014-02-08 NOTE — Progress Notes (Signed)
   Subjective:    Patient ID: Megan BakesMelissa J Meadows, female    DOB: 1980-12-02, 33 y.o.   MRN: [id number]  HPI Comments: Pt states she may have picked up a foreign body in her right 2nd toe while working in the yard 2 weeks ago.  Pt states she had a pedicure in the summer and now has a reddened area at the medial right 2nd toenail area.  Toe Pain       Review of Systems  All other systems reviewed and are negative.      Objective:   Physical Exam        Assessment & Plan:

## 2014-03-07 ENCOUNTER — Ambulatory Visit: Payer: BC Managed Care – PPO | Admitting: Podiatry

## 2014-03-08 ENCOUNTER — Ambulatory Visit (INDEPENDENT_AMBULATORY_CARE_PROVIDER_SITE_OTHER): Payer: BC Managed Care – PPO | Admitting: Podiatry

## 2014-03-08 ENCOUNTER — Encounter: Payer: Self-pay | Admitting: Podiatry

## 2014-03-08 VITALS — BP 110/76 | HR 75 | Resp 11

## 2014-03-08 DIAGNOSIS — B079 Viral wart, unspecified: Secondary | ICD-10-CM

## 2014-03-08 DIAGNOSIS — B078 Other viral warts: Secondary | ICD-10-CM

## 2014-03-08 NOTE — Progress Notes (Signed)
Subjective:     Patient ID: Megan BakesMelissa J Meadows, female   DOB: [birth year], 33 y.o.   MRN: [id number]  HPI patient states it seems to be better with one spot on my second toe which is still present but I did develop a blister   Review of Systems     Objective:   Physical Exam Neurovascular status intact muscle strength adequate with a small area with pinpoint bleeding in the plantar aspect of the right second toe with the medial side of the second toe doing very well with no indication of pathology    Assessment:     Small verruca plantaris plantar second toe right    Plan:     Debrided tissue and applied chemical to kill the wart cells present and applied sterile dressing. Gave instructions on reappoint as needed

## 2014-09-07 IMAGING — CT CT PARANASAL SINUSES LIMITED
1 series · 9 of 11 positions shown, 12 images · non-contrast
Comparison: None.

CLINICAL DATA: Nasal congestion.  Left-sided pressure.

CT PARANASAL SINUS LIMITED WITHOUT CONTRAST
TECHNIQUE: Multidetector CT images of the paranasal sinuses were
obtained in a single plane without contrast.

[Series 3: coronal soft · axial · 0.33mm/px · z∈[+38,+118]mm · 9 of 11 slices shown, 12 images]
[im 2/11  brain]
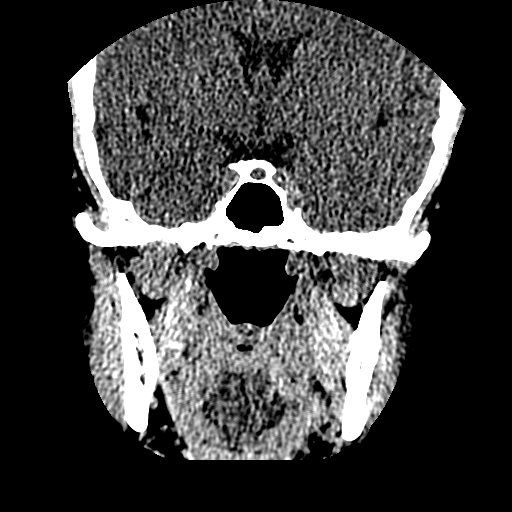
[im 2/11  bone]
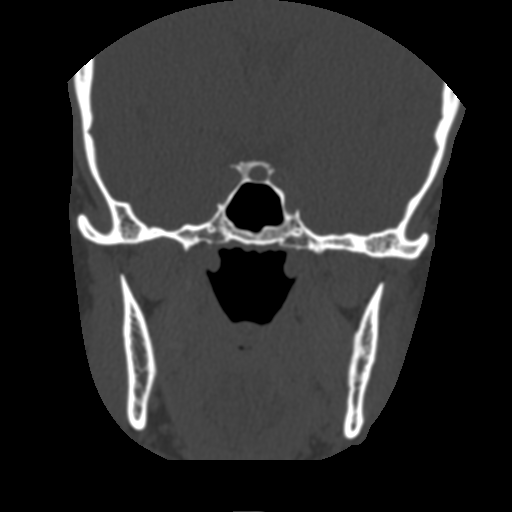
[im 3/11  bone]
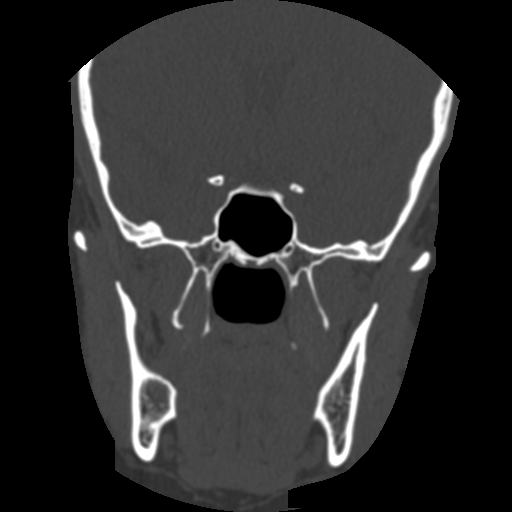
[im 4/11  bone]
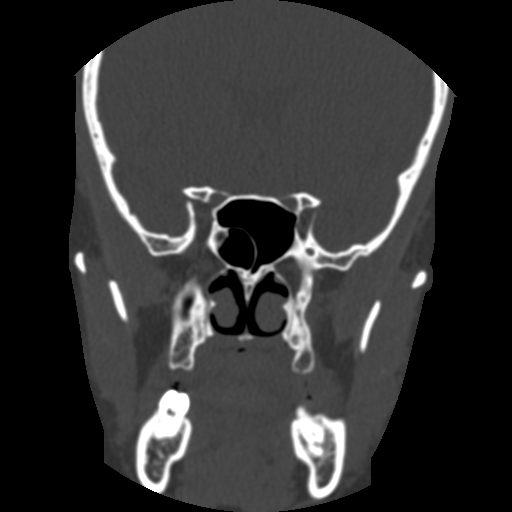
[im 5/11  bone]
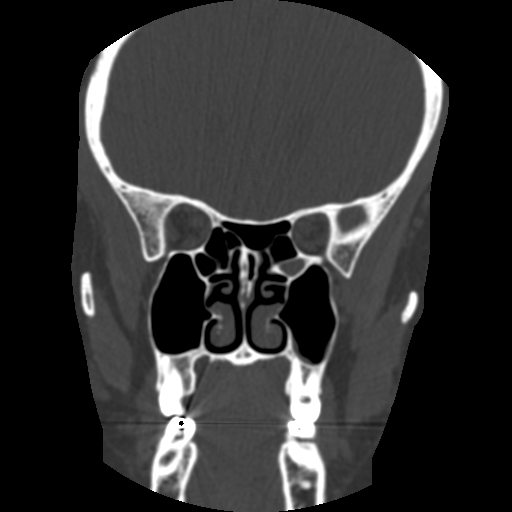
[im 6/11  brain]
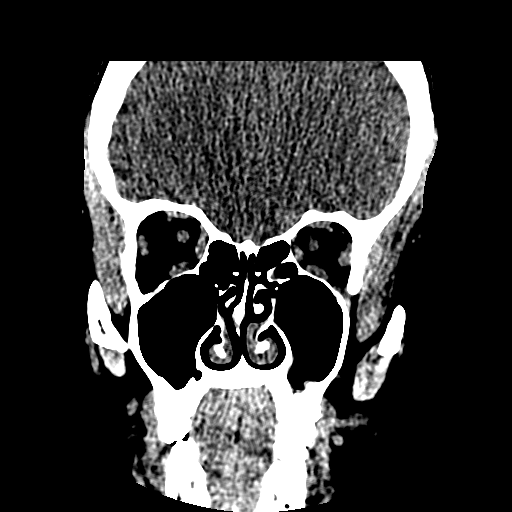
[im 6/11  bone]
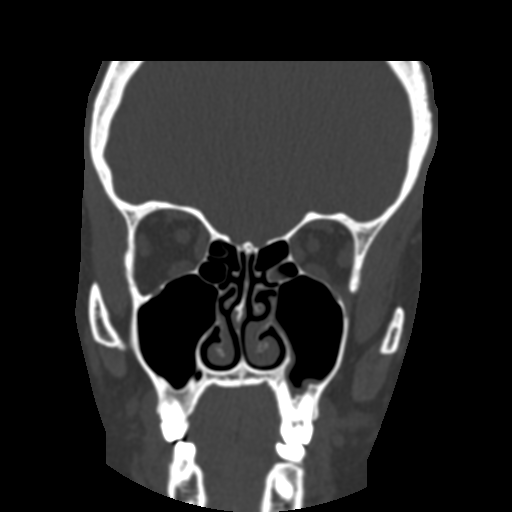
[im 7/11  bone]
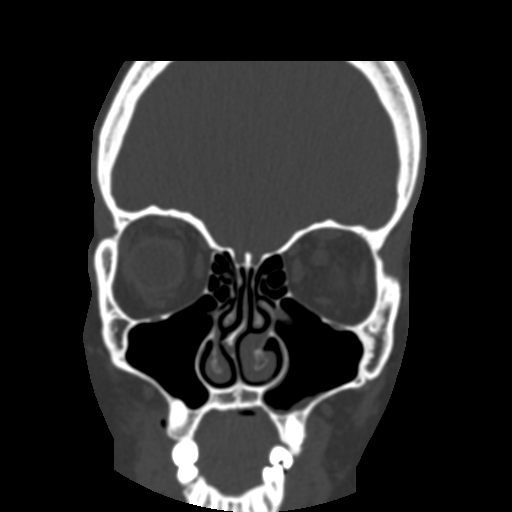
[im 8/11  bone]
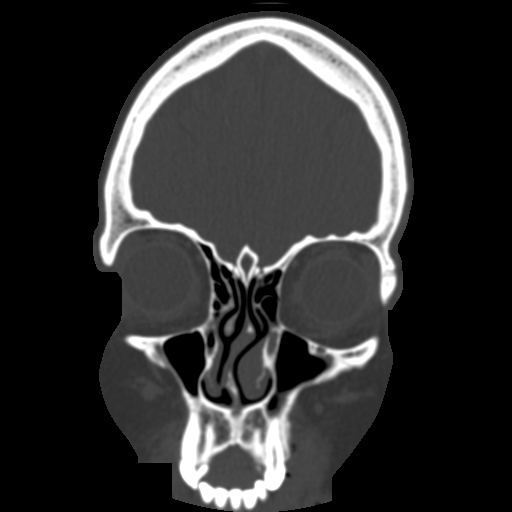
[im 9/11  bone]
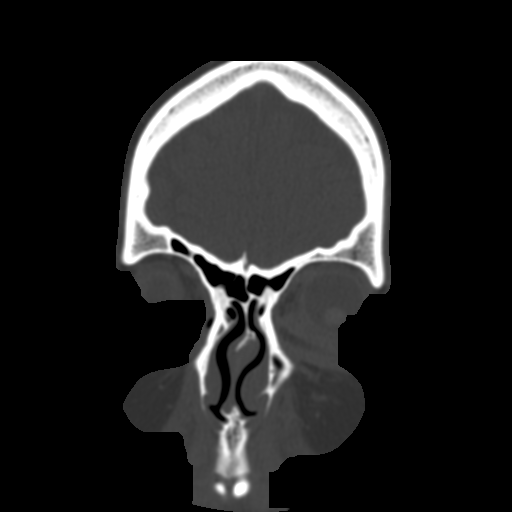
[im 10/11  brain]
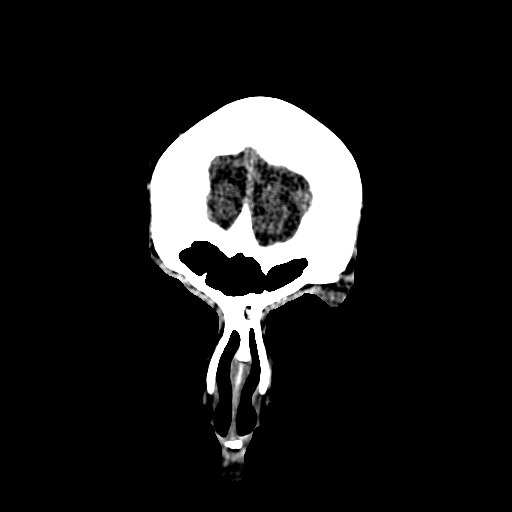
[im 10/11  bone]
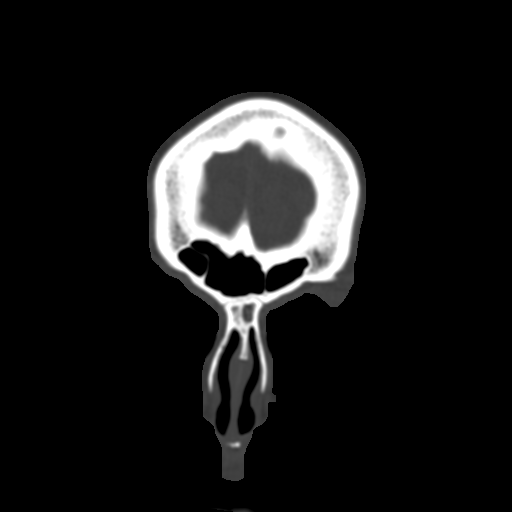

[9 of 11 positions shown; findings below may reference images not displayed]

FINDINGS: Screening examination of the paranasal sinuses
demonstrates mild mucosal thickening in the right maxillary sinus.
Leftward nasal septal spurring extends 7 mm beyond the midline.
Nasal cavity is clear.  The paranasal sinuses are otherwise clear.
Limited imaging of the brain is unremarkable.
IMPRESSION: 1.  Mild mucosal thickening within the right maxillary sinus.
2.  No other significant sinus disease.
3.  Leftward nasal septal deviation.

## 2016-09-10 ENCOUNTER — Other Ambulatory Visit: Payer: Self-pay | Admitting: Plastic Surgery

## 2018-08-18 ENCOUNTER — Ambulatory Visit: Payer: Self-pay | Admitting: Adult Health

## 2018-08-18 ENCOUNTER — Other Ambulatory Visit: Payer: Self-pay

## 2018-08-18 DIAGNOSIS — Z411 Encounter for cosmetic surgery: Secondary | ICD-10-CM

## 2018-09-04 NOTE — Progress Notes (Signed)
Patient treated with Botox Cosmetic/ OnabotulinumtoxinA  Risks and Benefits explained to patient, Written consent obtained and signed.   Lot#: [id number] Exp: 10/2020  Glabellar: 12 Units Frontalis: 4 units Crows Feet: 24 Units Other: 0 Units  Total: 40 Units  Aftercare instructions given to patient.  Patient denied any questions.  Will follow up in 14 days for results review.

## 2018-09-14 ENCOUNTER — Other Ambulatory Visit: Payer: Self-pay

## 2018-09-14 ENCOUNTER — Ambulatory Visit: Payer: Self-pay | Admitting: Adult Health

## 2018-09-14 DIAGNOSIS — Z411 Encounter for cosmetic surgery: Secondary | ICD-10-CM

## 2018-09-14 NOTE — Progress Notes (Signed)
Patient treated with Botox Cosmetic/ OnabotulinumtoxinA  Risks and Benefits explained to patient, Written consent obtained and signed.   Lot#: [id number] Exp: 11/2020  Glabellar: 5 Units Frontalis: 16 units Crows Feet: 0 Units Other: 0 Units  Total: 21 Units  Aftercare instructions given to patient.  Patient denied any questions.  Will follow up in 14 days for results review.

## 2019-03-14 ENCOUNTER — Other Ambulatory Visit: Payer: Self-pay | Admitting: Obstetrics & Gynecology

## 2019-03-14 DIAGNOSIS — Z1231 Encounter for screening mammogram for malignant neoplasm of breast: Secondary | ICD-10-CM

## 2019-05-03 ENCOUNTER — Ambulatory Visit: Payer: Self-pay

## 2022-02-25 ENCOUNTER — Encounter (HOSPITAL_BASED_OUTPATIENT_CLINIC_OR_DEPARTMENT_OTHER): Payer: Self-pay | Admitting: Obstetrics

## 2022-02-25 NOTE — Progress Notes (Signed)
Spoke w/ via phone for pre-op interview--- patient Lab needs dos----  CBC, T&S, urine pregnancy per MD orders             Lab results------ COVID test -----patient states asymptomatic no test needed Arrive at ------- 1015 on 03/03/2022 NPO after MN NO Solid Food.  Clear liquids from MN until--- 0915 on 03/03/2022 Med rec completed Medications to take morning of surgery ----- nortrel, xyzal Diabetic medication ----- n/a Patient instructed no nail polish to be worn day of surgery Patient instructed to bring photo id and insurance card day of surgery Patient aware to have Driver (ride ) / caregiver    for 24 hours after surgery - husband or mother, pt is unsure which family member will be here DOS Patient Special Instructions ----- none Pre-Op special Istructions ----- educated patient on order for ERAS shake, pt states she will call to arrange time to pick up if she decides to obtain drink  Patient verbalized understanding of instructions that were given at this phone interview. Patient denies shortness of breath, chest pain, fever, cough at this phone interview.  Frances Furbish, RN

## 2022-03-03 ENCOUNTER — Encounter (HOSPITAL_BASED_OUTPATIENT_CLINIC_OR_DEPARTMENT_OTHER)
Admission: RE | Disposition: A | Discharge status: Home / Self Care | Payer: Self-pay | Source: Ambulatory Visit | Attending: Obstetrics

## 2022-03-03 ENCOUNTER — Other Ambulatory Visit: Payer: Self-pay

## 2022-03-03 ENCOUNTER — Ambulatory Visit (HOSPITAL_BASED_OUTPATIENT_CLINIC_OR_DEPARTMENT_OTHER): Payer: BC Managed Care – PPO | Admitting: Anesthesiology

## 2022-03-03 ENCOUNTER — Ambulatory Visit (HOSPITAL_BASED_OUTPATIENT_CLINIC_OR_DEPARTMENT_OTHER)
Admission: RE | Admit: 2022-03-03 | Discharge: 2022-03-03 | Disposition: A | Discharge status: Home / Self Care | Payer: BC Managed Care – PPO | Source: Ambulatory Visit | Attending: Obstetrics | Admitting: Obstetrics

## 2022-03-03 ENCOUNTER — Encounter (HOSPITAL_BASED_OUTPATIENT_CLINIC_OR_DEPARTMENT_OTHER): Payer: Self-pay | Admitting: Obstetrics

## 2022-03-03 DIAGNOSIS — Z302 Encounter for sterilization: Secondary | ICD-10-CM | POA: Insufficient documentation

## 2022-03-03 DIAGNOSIS — Q506 Other congenital malformations of fallopian tube and broad ligament: Secondary | ICD-10-CM | POA: Diagnosis not present

## 2022-03-03 DIAGNOSIS — N920 Excessive and frequent menstruation with regular cycle: Secondary | ICD-10-CM | POA: Insufficient documentation

## 2022-03-03 DIAGNOSIS — R519 Headache, unspecified: Secondary | ICD-10-CM | POA: Insufficient documentation

## 2022-03-03 HISTORY — PX: DILITATION & CURRETTAGE/HYSTROSCOPY WITH NOVASURE ABLATION: SHX5568

## 2022-03-03 HISTORY — PX: LAPAROSCOPIC BILATERAL SALPINGECTOMY: SHX5889

## 2022-03-03 LAB — CBC
HCT: 44.9 % (ref 36.0–46.0)
Hemoglobin: 15 g/dL (ref 12.0–15.0)
MCH: 30.9 pg (ref 26.0–34.0)
MCHC: 33.4 g/dL (ref 30.0–36.0)
MCV: 92.4 fL (ref 80.0–100.0)
Platelets: 282 10*3/uL (ref 150–400)
RBC: 4.86 MIL/uL (ref 3.87–5.11)
RDW: 12.5 % (ref 11.5–15.5)
WBC: 8.6 10*3/uL (ref 4.0–10.5)
nRBC: 0 % (ref 0.0–0.2)

## 2022-03-03 LAB — TYPE AND SCREEN
ABO/RH(D): O POS
Antibody Screen: NEGATIVE

## 2022-03-03 LAB — ABO/RH: ABO/RH(D): O POS

## 2022-03-03 LAB — POCT PREGNANCY, URINE: Preg Test, Ur: NEGATIVE

## 2022-03-03 SURGERY — SALPINGECTOMY, BILATERAL, LAPAROSCOPIC
Anesthesia: General | Site: Uterus

## 2022-03-03 MED ORDER — OXYCODONE HCL 5 MG/5ML PO SOLN: 5.0000 mg | Freq: Once | ORAL | Status: DC | PRN

## 2022-03-03 MED ORDER — ACETAMINOPHEN 160 MG/5ML PO SOLN: 325.0000 mg | ORAL | Status: DC | PRN

## 2022-03-03 MED ORDER — KETOROLAC TROMETHAMINE 30 MG/ML IJ SOLN
INTRAMUSCULAR | Status: DC | PRN
  Administered 2022-03-03: 30 mg via INTRAVENOUS

## 2022-03-03 MED ORDER — DEXMEDETOMIDINE HCL IN NACL 400 MCG/100ML IV SOLN
INTRAVENOUS | Status: DC | PRN
  Administered 2022-03-03: 8 ug via INTRAVENOUS

## 2022-03-03 MED ORDER — DEXAMETHASONE SODIUM PHOSPHATE 10 MG/ML IJ SOLN
INTRAMUSCULAR | Status: DC | PRN
  Administered 2022-03-03: 10 mg via INTRAVENOUS

## 2022-03-03 MED ORDER — FENTANYL CITRATE (PF) 100 MCG/2ML IJ SOLN: 25.0000 ug | INTRAMUSCULAR | Status: DC | PRN

## 2022-03-03 MED ORDER — AMISULPRIDE (ANTIEMETIC) 5 MG/2ML IV SOLN: 10.0000 mg | Freq: Once | INTRAVENOUS | Status: DC | PRN

## 2022-03-03 MED ORDER — PROPOFOL 10 MG/ML IV BOLUS
INTRAVENOUS | Status: DC | PRN
  Administered 2022-03-03: 140 mg via INTRAVENOUS

## 2022-03-03 MED ORDER — PROMETHAZINE HCL 25 MG/ML IJ SOLN: 6.2500 mg | INTRAMUSCULAR | Status: DC | PRN

## 2022-03-03 MED ORDER — PHENYLEPHRINE 80 MCG/ML (10ML) SYRINGE FOR IV PUSH (FOR BLOOD PRESSURE SUPPORT)
PREFILLED_SYRINGE | INTRAVENOUS | Status: DC | PRN
  Administered 2022-03-03: 80 ug via INTRAVENOUS

## 2022-03-03 MED ORDER — LIDOCAINE HCL 1 % IJ SOLN
INTRAMUSCULAR | Status: DC | PRN
  Administered 2022-03-03: 10 mL

## 2022-03-03 MED ORDER — SODIUM CHLORIDE 0.9 % IR SOLN
Status: DC | PRN
  Administered 2022-03-03: 3000 mL

## 2022-03-03 MED ORDER — PHENYLEPHRINE 80 MCG/ML (10ML) SYRINGE FOR IV PUSH (FOR BLOOD PRESSURE SUPPORT)
PREFILLED_SYRINGE | INTRAVENOUS | Status: AC
  Filled 2022-03-03: qty 10

## 2022-03-03 MED ORDER — FENTANYL CITRATE (PF) 100 MCG/2ML IJ SOLN
INTRAMUSCULAR | Status: DC | PRN
  Administered 2022-03-03 (×2): 50 ug via INTRAVENOUS

## 2022-03-03 MED ORDER — ACETAMINOPHEN 10 MG/ML IV SOLN
INTRAVENOUS | Status: AC
  Filled 2022-03-03: qty 100

## 2022-03-03 MED ORDER — LACTATED RINGERS IV SOLN: INTRAVENOUS | Status: DC

## 2022-03-03 MED ORDER — ACETAMINOPHEN 325 MG PO TABS: 325.0000 mg | ORAL_TABLET | ORAL | Status: DC | PRN

## 2022-03-03 MED ORDER — PROPOFOL 10 MG/ML IV BOLUS
INTRAVENOUS | Status: AC
  Filled 2022-03-03: qty 20

## 2022-03-03 MED ORDER — KETOROLAC TROMETHAMINE 30 MG/ML IJ SOLN
INTRAMUSCULAR | Status: AC
  Filled 2022-03-03: qty 1

## 2022-03-03 MED ORDER — ROCURONIUM BROMIDE 10 MG/ML (PF) SYRINGE
PREFILLED_SYRINGE | INTRAVENOUS | Status: DC | PRN
  Administered 2022-03-03: 60 mg via INTRAVENOUS

## 2022-03-03 MED ORDER — ONDANSETRON HCL 4 MG/2ML IJ SOLN
INTRAMUSCULAR | Status: AC
  Filled 2022-03-03: qty 2

## 2022-03-03 MED ORDER — ACETAMINOPHEN 10 MG/ML IV SOLN: 1000.0000 mg | Freq: Once | INTRAVENOUS | Status: DC | PRN

## 2022-03-03 MED ORDER — SUGAMMADEX SODIUM 200 MG/2ML IV SOLN
INTRAVENOUS | Status: DC | PRN
  Administered 2022-03-03: 150 mg via INTRAVENOUS
  Administered 2022-03-03: 50 mg via INTRAVENOUS

## 2022-03-03 MED ORDER — MIDAZOLAM HCL 2 MG/2ML IJ SOLN
INTRAMUSCULAR | Status: DC | PRN
  Administered 2022-03-03: 2 mg via INTRAVENOUS

## 2022-03-03 MED ORDER — SCOPOLAMINE 1 MG/3DAYS TD PT72
MEDICATED_PATCH | TRANSDERMAL | Status: AC
  Filled 2022-03-03: qty 1

## 2022-03-03 MED ORDER — DEXMEDETOMIDINE HCL IN NACL 80 MCG/20ML IV SOLN
INTRAVENOUS | Status: AC
  Filled 2022-03-03: qty 20

## 2022-03-03 MED ORDER — LIDOCAINE 2% (20 MG/ML) 5 ML SYRINGE
INTRAMUSCULAR | Status: DC | PRN
  Administered 2022-03-03: 40 mg via INTRAVENOUS

## 2022-03-03 MED ORDER — OXYCODONE HCL 5 MG PO TABS: 5.0000 mg | ORAL_TABLET | Freq: Once | ORAL | Status: DC | PRN

## 2022-03-03 MED ORDER — FENTANYL CITRATE (PF) 100 MCG/2ML IJ SOLN
INTRAMUSCULAR | Status: AC
  Filled 2022-03-03: qty 2

## 2022-03-03 MED ORDER — SCOPOLAMINE 1 MG/3DAYS TD PT72
1.0000 | MEDICATED_PATCH | TRANSDERMAL | Status: DC
  Administered 2022-03-03: 1.5 mg via TRANSDERMAL

## 2022-03-03 MED ORDER — POVIDONE-IODINE 10 % EX SWAB: 2.0000 | Freq: Once | CUTANEOUS | Status: DC

## 2022-03-03 MED ORDER — ONDANSETRON HCL 4 MG/2ML IJ SOLN
INTRAMUSCULAR | Status: DC | PRN
  Administered 2022-03-03: 4 mg via INTRAVENOUS

## 2022-03-03 MED ORDER — DEXAMETHASONE SODIUM PHOSPHATE 10 MG/ML IJ SOLN
INTRAMUSCULAR | Status: AC
  Filled 2022-03-03: qty 1

## 2022-03-03 MED ORDER — ROCURONIUM BROMIDE 10 MG/ML (PF) SYRINGE
PREFILLED_SYRINGE | INTRAVENOUS | Status: AC
  Filled 2022-03-03: qty 10

## 2022-03-03 MED ORDER — MIDAZOLAM HCL 2 MG/2ML IJ SOLN
INTRAMUSCULAR | Status: AC
  Filled 2022-03-03: qty 2

## 2022-03-03 MED ORDER — BUPIVACAINE HCL (PF) 0.25 % IJ SOLN
INTRAMUSCULAR | Status: DC | PRN
  Administered 2022-03-03: 10 mL

## 2022-03-03 MED ORDER — ACETAMINOPHEN 10 MG/ML IV SOLN
INTRAVENOUS | Status: DC | PRN
  Administered 2022-03-03: 1000 mg via INTRAVENOUS

## 2022-03-03 SURGICAL SUPPLY — 25 items
ABLATOR SURESOUND NOVASURE (ABLATOR) IMPLANT
ADH SKN CLS APL DERMABOND .7 (GAUZE/BANDAGES/DRESSINGS) ×2
COVER MAYO STAND STRL (DRAPES) ×3 IMPLANT
DERMABOND ADVANCED .7 DNX12 (GAUZE/BANDAGES/DRESSINGS) ×3 IMPLANT
DURAPREP 26ML APPLICATOR (WOUND CARE) ×3 IMPLANT
GLOVE BIOGEL PI IND STRL 6.5 (GLOVE) ×3 IMPLANT
GLOVE BIOGEL PI IND STRL 7.0 (GLOVE) ×3 IMPLANT
GOWN STRL REUS W/TWL LRG LVL3 (GOWN DISPOSABLE) ×6 IMPLANT
IV NS IRRIG 3000ML ARTHROMATIC (IV SOLUTION) IMPLANT
KIT PROCEDURE FLUENT (KITS) ×3 IMPLANT
KIT TURNOVER CYSTO (KITS) ×3 IMPLANT
LIGASURE VESSEL 5MM BLUNT TIP (ELECTROSURGICAL) IMPLANT
NS IRRIG 500ML POUR BTL (IV SOLUTION) IMPLANT
PACK LAPAROSCOPY BASIN (CUSTOM PROCEDURE TRAY) ×3 IMPLANT
PACK TRENDGUARD 450 HYBRID PRO (MISCELLANEOUS) IMPLANT
PACK VAGINAL MINOR WOMEN LF (CUSTOM PROCEDURE TRAY) ×3 IMPLANT
PAD OB MATERNITY 4.3X12.25 (PERSONAL CARE ITEMS) ×3 IMPLANT
SEAL ROD LENS SCOPE MYOSURE (ABLATOR) ×3 IMPLANT
SET TUBE SMOKE EVAC HIGH FLOW (TUBING) ×3 IMPLANT
SLEEVE Z-THREAD 5X100MM (TROCAR) IMPLANT
SUT VICRYL RAPIDE 4/0 PS 2 (SUTURE) ×3 IMPLANT
TOWEL OR 17X26 10 PK STRL BLUE (TOWEL DISPOSABLE) ×6 IMPLANT
TRENDGUARD 450 HYBRID PRO PACK (MISCELLANEOUS) ×2
TROCAR Z-THREAD FIOS 5X100MM (TROCAR) ×9 IMPLANT
WARMER LAPAROSCOPE (MISCELLANEOUS) ×3 IMPLANT

## 2022-03-03 NOTE — Anesthesia Preprocedure Evaluation (Addendum)
Anesthesia Evaluation  Patient identified by MRN, date of birth, ID band Patient awake    Reviewed: Allergy & Precautions, NPO status , Patient's Chart, lab work & pertinent test results  Airway Mallampati: I  TM Distance: >3 FB Neck ROM: Full    Dental  (+) Teeth Intact, Dental Advisory Given   Pulmonary neg pulmonary ROS   breath sounds clear to auscultation       Cardiovascular negative cardio ROS  Rhythm:Regular Rate:Normal     Neuro/Psych  Headaches  negative psych ROS   GI/Hepatic negative GI ROS, Neg liver ROS,,,  Endo/Other  negative endocrine ROS    Renal/GU negative Renal ROS     Musculoskeletal negative musculoskeletal ROS (+)    Abdominal Normal abdominal exam  (+)   Peds  Hematology negative hematology ROS (+)   Anesthesia Other Findings   Reproductive/Obstetrics                             Anesthesia Physical Anesthesia Plan  ASA: 2  Anesthesia Plan: General   Post-op Pain Management:    Induction: Intravenous  PONV Risk Score and Plan: 4 or greater and Ondansetron, Dexamethasone, Midazolam and Scopolamine patch - Pre-op  Airway Management Planned: Oral ETT  Additional Equipment: None  Intra-op Plan:   Post-operative Plan: Extubation in OR  Informed Consent: I have reviewed the patients History and Physical, chart, labs and discussed the procedure including the risks, benefits and alternatives for the proposed anesthesia with the patient or authorized representative who has indicated his/her understanding and acceptance.     Dental advisory given  Plan Discussed with: CRNA  Anesthesia Plan Comments:        Anesthesia Quick Evaluation

## 2022-03-03 NOTE — Anesthesia Procedure Notes (Signed)
Procedure Name: Intubation Date/Time: 03/03/2022 12:26 PM  Performed by: Mechele Claude, CRNAPre-anesthesia Checklist: Patient identified, Emergency Drugs available, Suction available and Patient being monitored Patient Re-evaluated:Patient Re-evaluated prior to induction Oxygen Delivery Method: Circle system utilized Preoxygenation: Pre-oxygenation with 100% oxygen Induction Type: IV induction Ventilation: Mask ventilation without difficulty Laryngoscope Size: Mac and 3 Grade View: Grade I Tube type: Oral Number of attempts: 1 Airway Equipment and Method: Stylet and Oral airway Placement Confirmation: ETT inserted through vocal cords under direct vision, positive ETCO2 and breath sounds checked- equal and bilateral Secured at: 21 cm Tube secured with: Tape Dental Injury: Teeth and Oropharynx as per pre-operative assessment

## 2022-03-03 NOTE — Op Note (Signed)
Pre-Operative Diagnosis: heavy menstrual bleeding, desires permanent sterilization   Postoperative Diagnosis: heavy menstrual bleeding, desires permanent sterilization  Procedure: hysteroscopy, dilation and curettage, novasure endometrial ablation, laparoscopic bilateral salpingectomy    Surgeon: Marlow Baars, MD   Assistant:Michelle Henderson Cloud, MD   Anesthesia: General endotracheal anesthesia and 0.25% Marcaine injected at all trocar sites.   Operative Findings:Normal appearing ovaries, tubes, and uterus. Cervix length 72mm, cavity length 36mm, cavity width 4 mm.  Treatment time 133 seconds  Specimen: bilateral fallopian tubes and endometrial curettings   FFM:[id number]   Procedure: Following the appropriate informed consent the patient was taken to the operating room. She was placed in dorsal lithotomy position, and general anesthesia was administered. The abdomen, perineum, and vagina, were prepped in the normal sterile fashion.  A speculum was placed in the vagina.  A tenaculum was placed on the anterior cervix.  The cervical length (4 mm) and cavity length (7mm; uterus sounded to 42mm) were obtained.  The cervix was serially dilated.  A hysteroscope was passed easily through the cervix and the endometrial cavity surveyed.  The cavity was uniform with thin endometrium.  Both tubal ostia were visualized.   The Novasure device was opened and deployed to evaluate the electrode array, and once confirmed to be normal it was retracted completely into the external sheath. The uterine cavity length was locked into the device handle. The NovaSure was then introduced into the uterus and deployed. The uterine width was determined and entered into the controller. The Cavity integrity test was passed and the controller was enabled per standard protocol. The ablation cycle was initiated and completed without difficulty.  The treatment time was 133 seconds.  The NovaSure was closed and removed.  The tenaculum was  removed and a hulka uterine manipulator was placed transcervically. The speculum was removed from the vagina.   Attention was then turned to the abdominal portion of the case.  0.25% Marcaine was injected infraumbilically. An incision was made at the base of the umbilicus with a scalpel. The 5 mm direct view trocar was used to enter the abdomen under direct visualization. Entry was confirmed and the abdomen was insufflated with  CO2 gas.  The intra-abdominal cavity was inspected and findings were as above. The patient was placed in Trendelenburg. Under direct visualization, two additional 5-mm ports were placed into the right and left lower quadrant.  The uterus was elevated.  The abdomen and pelvis were surveyed and findings noted as above.  The LigaSure was then introduced into the abdomen. The left fallopian tube was grasped and the LigaSure was used to sequentially clamp, cut, and burn the mesosalpinx.  The tube was transected at the level of the coruna.  Likewise, the right fallopian tube was grasped and the LigaSure was used to sequentially clamp, cut, and burn the mesosalpinx.  The tube was transected at the level of the coruna. Both tubes were removed from the abdomen.  There was a small area of trauma to the anterior leaf of the broad ligament adjacent to the right round ligament.  This did not pass through the posterior leaf.  The edge was cauterized with the LigaSure and excellent hemostasis was noted.  The pelvis was surveyed and hemostasis was noted.  This completed the procedure. The abdomen was desufflated and the the ports were removed. The skin was closed with Dermabond.  The uterine manipulator was removed from the vagina. All sponge lap needle counts were correct x2.  The patient tolerated the procedure well and  was brought to the recovery room in stable condition

## 2022-03-03 NOTE — H&P (Signed)
41 y.o. [id number] presents for endometrial ablation and bilateral salpingectomy.   She has a history of regular menses, bleeds 4-5d, first 1-2d very heavy, changing q3-4. Lots of cramping.  Tried IUD--bleeding improved, but acne flared + cramping.  Now currently managed on OCPs--has switched pills a few different times due to AUB.  Had a gyn scan which revealed a normal anteverted uterus without evidence of fibroids or adenomyosis    Past Medical History:  Diagnosis Date   Headache(784.0)    migraines   Internal nasal lesion 08/21/2012   nasal nodule    Past Surgical History:  Procedure Laterality Date   ANKLE SURGERY Left 2015   ankle/tendon repair   CESAREAN SECTION  02/24/2007   EXCISION NASAL MASS N/A 09/20/2012   Procedure: EXCISION OF A  INNER NASAL LESION;  Surgeon: Drema Halon, MD;  Location: [facility];  Service: ENT;  Laterality: N/A;   NASAL SEPTUM SURGERY  02/21/2012    OB History  No obstetric history on file.    Social History   Socioeconomic History   Marital status: Married    Spouse name: Not on file   Number of children: Not on file   Years of education: Not on file   Highest education level: Not on file  Occupational History   Not on file  Tobacco Use   Smoking status: Never   Smokeless tobacco: Never  Vaping Use   Vaping Use: Never used  Substance and Sexual Activity   Alcohol use: No   Drug use: No   Sexual activity: Yes    Birth control/protection: Pill  Other Topics Concern   Not on file  Social History Narrative   Not on file   Social Determinants of Health   Financial Resource Strain: Not on file  Food Insecurity: Not on file  Transportation Needs: Not on file  Physical Activity: Not on file  Stress: Not on file  Social Connections: Not on file  Intimate Partner Violence: Not on file   Sulfa antibiotics, Latex, and Monistat [miconazole nitrate-wipes]    Vitals:   03/03/22 1055  BP: 128/84  Pulse: 96  Resp:  15  Temp: (!) 97 F (36.1 C)  SpO2: 99%     General:  NAD Lungs: CTAB Cardiac: RRR Abdomen:  Soft Pelvic:  vulva normal.  Vagina normal.  Uterus anteverted 8 week size, mobile Ex:  no edema    A/P   41 y.o. presents for permanent sterilization via bilateral salpingectomy and endometrial ablation for heavy menstrual bleeding. Discussed permanent nature of procedure. Discussed the risks of surgery, specifically, the patient was apprised of risks of pain, bleeding requiring blood transfusion, infection requiring antibiotics, injury to nearby organs (bowel, bladder, nerves, blood vessels, ureter), need for laparotomy to complete the operation, or failure to achieve desired results. We discussed the risk of tubal failure, resulting in pregnancy and possible ectopic pregnancy, as well as the risk of regret. With respect to the ablation, reviewed goal of decreased flow and not necessarily amenorrhea. Discussed possibility of uterine perforation with need to abort procedure, inability of device to deploy based on cavity size, failure rate vs amenorrhea rate, potential need for hysterectomy if ablation fails.  If salpingectomy is unable to be performed for any reason, she is ok with filshie clips as back up.  She understands and elects to proceed.  Consent signed.  UPT negative in holding.   She is low risk for endometrial hyperplasia / pathology, so elected  to defer EMB in office .  Will obtain D&C prior to ablation  If unable to perform ablation for any reason, still desires to proceed with sterilization    [facility] GEFFEL Chestine Spore

## 2022-03-03 NOTE — Transfer of Care (Signed)
Immediate Anesthesia Transfer of Care Note  Patient: [name]  Procedure(s) Performed: Procedure(s) (LRB): LAPAROSCOPIC BILATERAL SALPINGECTOMY (Bilateral) DILATATION & CURETTAGE/HYSTEROSCOPY WITH NOVASURE ABLATION (N/A)  Patient Location: PACU  Anesthesia Type: General  Level of Consciousness: awake, alert  and oriented  Airway & Oxygen Therapy: Patient Spontanous Breathing and Patient connected to nasal cannula oxygen  Post-op Assessment: Report given to PACU RN and Post -op Vital signs reviewed and stable  Post vital signs: Reviewed and stable  Complications: No apparent anesthesia complications  Last Vitals:  Vitals Value Taken Time  BP 134/97 03/03/22 1330  Temp    Pulse 80 03/03/22 1331  Resp 21 03/03/22 1331  SpO2 93 % 03/03/22 1331  Vitals shown include unvalidated device data.  Last Pain:  Vitals:   03/03/22 1055  TempSrc: Oral  PainSc: 0-No pain      Patients Stated Pain Goal: 3 (03/03/22 1055)  Complications: No notable events documented.

## 2022-03-03 NOTE — Discharge Instructions (Addendum)
You may take motrin 800 mg every 6 hours and acetaminophen 1000 mg every 6 hours for pain  Pelvic rest x 2 weeks (no intercourse or tampons).  Call your doctor if you have heavy vaginal bleeding (soaking through a pad an hour or more for >2 hours in a row), temperature >101F, severe nausea, vomiting, severe or worsening abdominal pain, dizziness, shortness of breath, chest pain or any other concerns.      No acetaminophen/Tylenol until after 7:00 pm today if needed.  No ibuprofen, Advil, Aleve, Motrin, ketorolac, meloxicam, naproxen, or other NSAIDS until after 7:00 pm today if needed.     Post Anesthesia Home Care Instructions  Activity: Get plenty of rest for the remainder of the day. A responsible individual must stay with you for 24 hours following the procedure.  For the next 24 hours, DO NOT: -Drive a car -Advertising copywriter -Drink alcoholic beverages -Take any medication unless instructed by your physician -Make any legal decisions or sign important papers.  Meals: Start with liquid foods such as gelatin or soup. Progress to regular foods as tolerated. Avoid greasy, spicy, heavy foods. If nausea and/or vomiting occur, drink only clear liquids until the nausea and/or vomiting subsides. Call your physician if vomiting continues.  Special Instructions/Symptoms: Your throat may feel dry or sore from the anesthesia or the breathing tube placed in your throat during surgery. If this causes discomfort, gargle with warm salt water. The discomfort should disappear within 24 hours.  If you had a scopolamine patch placed behind your ear for the management of post- operative nausea and/or vomiting:  1. The medication in the patch is effective for 72 hours, after which it should be removed.  Wrap patch in a tissue and discard in the trash. Wash hands thoroughly with soap and water. 2. You may remove the patch earlier than 72 hours if you experience unpleasant side effects which may  include dry mouth, dizziness or visual disturbances. 3. Avoid touching the patch. Wash your hands with soap and water after contact with the patch.

## 2022-03-04 NOTE — Anesthesia Postprocedure Evaluation (Signed)
Anesthesia Post Note  Patient: [name]  Procedure(s) Performed: LAPAROSCOPIC BILATERAL SALPINGECTOMY (Bilateral: Abdomen) DILATATION & CURETTAGE/HYSTEROSCOPY WITH NOVASURE ABLATION (Uterus)     Patient location during evaluation: PACU Anesthesia Type: General Level of consciousness: awake and alert Pain management: pain level controlled Vital Signs Assessment: post-procedure vital signs reviewed and stable Respiratory status: spontaneous breathing, nonlabored ventilation, respiratory function stable and patient connected to nasal cannula oxygen Cardiovascular status: blood pressure returned to baseline and stable Postop Assessment: no apparent nausea or vomiting Anesthetic complications: no   No notable events documented.  Last Vitals:  Vitals:   03/03/22 1400 03/03/22 1426  BP: 128/84 (!) 145/92  Pulse: 66 70  Resp: 19 16  Temp:  36.8 C  SpO2: 96% 99%    Last Pain:  Vitals:   03/04/22 1119  TempSrc:   PainSc: 3                  Shelton Silvas

## 2022-03-05 ENCOUNTER — Encounter (HOSPITAL_BASED_OUTPATIENT_CLINIC_OR_DEPARTMENT_OTHER): Payer: Self-pay | Admitting: Obstetrics

## 2022-03-05 LAB — SURGICAL PATHOLOGY
